# Patient Record
Sex: Female | Born: 1980 | Race: White | Hispanic: No | Marital: Married | Smoking: Former smoker
Health system: Southern US, Community
[De-identification: ages and names within clinical notes are randomized; demographics above are authoritative.]

---

## 2019-12-09 ENCOUNTER — Encounter: Payer: Self-pay | Admitting: Family Medicine

## 2019-12-09 ENCOUNTER — Ambulatory Visit: Payer: Managed Care, Other (non HMO) | Admitting: Family Medicine

## 2019-12-09 ENCOUNTER — Other Ambulatory Visit: Payer: Self-pay

## 2019-12-09 ENCOUNTER — Ambulatory Visit (INDEPENDENT_AMBULATORY_CARE_PROVIDER_SITE_OTHER): Payer: Managed Care, Other (non HMO)

## 2019-12-09 VITALS — BP 110/70 | HR 90 | Wt 197.0 lb

## 2019-12-09 DIAGNOSIS — M545 Low back pain, unspecified: Secondary | ICD-10-CM

## 2019-12-09 DIAGNOSIS — M542 Cervicalgia: Secondary | ICD-10-CM

## 2019-12-09 DIAGNOSIS — M2550 Pain in unspecified joint: Secondary | ICD-10-CM | POA: Diagnosis not present

## 2019-12-09 LAB — COMPREHENSIVE METABOLIC PANEL
ALT: 27 U/L (ref 0–35)
AST: 17 U/L (ref 0–37)
Albumin: 4.3 g/dL (ref 3.5–5.2)
Alkaline Phosphatase: 80 U/L (ref 39–117)
BUN: 14 mg/dL (ref 6–23)
CO2: 24 mEq/L (ref 19–32)
Calcium: 9.2 mg/dL (ref 8.4–10.5)
Chloride: 104 mEq/L (ref 96–112)
Creatinine, Ser: 0.76 mg/dL (ref 0.40–1.20)
GFR: 84.37 mL/min (ref >60.00)
Glucose, Bld: 100 mg/dL — ABNORMAL HIGH (ref 70–99)
Potassium: 3.8 mEq/L (ref 3.5–5.1)
Sodium: 135 mEq/L (ref 135–145)
Total Bilirubin: 0.3 mg/dL (ref 0.2–1.2)
Total Protein: 7.5 g/dL (ref 6.0–8.3)

## 2019-12-09 LAB — CBC WITH DIFFERENTIAL/PLATELET
Basophils Absolute: 0.1 10*3/uL (ref 0.0–0.1)
Basophils Relative: 0.8 % (ref 0.0–3.0)
Eosinophils Absolute: 0.2 10*3/uL (ref 0.0–0.7)
Eosinophils Relative: 2.9 % (ref 0.0–5.0)
HCT: 38.6 % (ref 36.0–46.0)
Hemoglobin: 12.9 g/dL (ref 12.0–15.0)
Lymphocytes Relative: 28.8 % (ref 12.0–46.0)
Lymphs Abs: 2.3 10*3/uL (ref 0.7–4.0)
MCHC: 33.3 g/dL (ref 30.0–36.0)
MCV: 82.2 fl (ref 78.0–100.0)
Monocytes Absolute: 0.5 10*3/uL (ref 0.1–1.0)
Monocytes Relative: 5.8 % (ref 3.0–12.0)
Neutro Abs: 4.9 10*3/uL (ref 1.4–7.7)
Neutrophils Relative %: 61.7 % (ref 43.0–77.0)
Platelets: 284 10*3/uL (ref 150.0–400.0)
RBC: 4.7 Mil/uL (ref 3.87–5.11)
RDW: 13.7 % (ref 11.5–15.5)
WBC: 8 10*3/uL (ref 4.0–10.5)

## 2019-12-09 LAB — URIC ACID: Uric Acid, Serum: 4.8 mg/dL (ref 2.4–7.0)

## 2019-12-09 LAB — C-REACTIVE PROTEIN: CRP: <1 mg/dL (ref 0.5–20.0)

## 2019-12-09 LAB — TSH: TSH: 3.38 u[IU]/mL (ref 0.35–4.50)

## 2019-12-09 LAB — VITAMIN D 25 HYDROXY (VIT D DEFICIENCY, FRACTURES): VITD: 26.64 ng/mL — ABNORMAL LOW (ref 30.00–100.00)

## 2019-12-09 LAB — IBC PANEL
Iron: 52 ug/dL (ref 42–145)
Saturation Ratios: 11.7 % — ABNORMAL LOW (ref 20.0–50.0)
Transferrin: 318 mg/dL (ref 212.0–360.0)

## 2019-12-09 LAB — SEDIMENTATION RATE: Sed Rate: 46 mm/hr — ABNORMAL HIGH (ref 0–20)

## 2019-12-09 LAB — FERRITIN: Ferritin: 16.6 ng/mL (ref 10.0–291.0)

## 2019-12-09 NOTE — Progress Notes (Signed)
[name] [address] [place] [address] Phone: [phone] Subjective:   I Laura Fry am serving as a Neurosurgeon for Dr. Antoine Primas.  This visit occurred during the SARS-CoV-2 public health emergency.  Safety protocols were in place, including screening questions prior to the visit, additional usage of staff PPE, and extensive cleaning of exam room while observing appropriate contact time as indicated for disinfecting solutions.   I'm seeing this patient by the request  of:  Patient, No Pcp Per  CC: Allover pain  [id number]  Laura Fry is a 39 y.o. female coming in with complaint of back pain. Body aches all over her body. Patient states that she also has hearing and mood swings. Has had MRIs before for this pain. Patient states she also has trouble with thinking as well. Accident in 2017 states that since then she has not been the same.   Onset- Chronic  Location - back, bilateral shoulders, anterior neck muscles, sternocleidomastoid  Duration-  Character-stiff  Aggravating factors- sitting for long periods of time, cleaning  Reliving factors-  Therapies tried- chiropractor, PT, acupuncture, myofacial release, massage, lymp draining  Severity-9 out of 10.  Patient states that she cannot tell me exactly what MRI is she has had that she has had in multiple she states.      Social History   Socioeconomic History  . Marital status: Married    Spouse name: Not on file  . Number of children: Not on file  . Years of education: Not on file  . Highest education level: Not on file  Occupational History  . Not on file  Tobacco Use  . Smoking status: Not on file  Substance and Sexual Activity  . Alcohol use: Not on file  . Drug use: Not on file  . Sexual activity: Not on file  Other Topics Concern  . Not on file  Social History Narrative  . Not on file   Social Determinants of Health   Financial Resource Strain:   .  Difficulty of Paying Living Expenses:   Food Insecurity:   . Worried About Programme researcher, broadcasting/film/video in the Last Year:   . Barista in the Last Year:   Transportation Needs:   . Freight forwarder (Medical):   Marland Kitchen Lack of Transportation (Non-Medical):   Physical Activity:   . Days of Exercise per Week:   . Minutes of Exercise per Session:   Stress:   . Feeling of Stress :   Social Connections:   . Frequency of Communication with Friends and Family:   . Frequency of Social Gatherings with Friends and Family:   . Attends Religious Services:   . Active Member of Clubs or Organizations:   . Attends Banker Meetings:   Marland Kitchen Marital Status:    Not on File No family history on file. No current outpatient medications on file.   Reviewed prior external information including notes and imaging from  primary care provider As well as notes that were available from care everywhere and other healthcare systems.  Past medical history, social, surgical and family history all reviewed in electronic medical record.  No pertanent information unless stated regarding to the chief complaint.   Review of Systems:  No , visual changes, nausea, vomiting, diarrhea, constipation, dizziness, abdominal pain, skin rash, fevers, chills, night sweats, weight loss, swollen lymph nodes,  chest pain, shortness of breath, mood changes. POSITIVE muscle aches, body aches, joint  swelling, mood changes, headaches  Objective  Blood pressure 110/70, pulse 90, weight 197 lb (89.4 kg), SpO2 98 %.   General: No apparent distress alert and oriented x3 mood and affect normal, dressed appropriately.  HEENT: Pupils equal, extraocular movements intact  Respiratory: Patient's speak in full sentences and does not appear short of breath  Cardiovascular: No lower extremity edema, non tender, no erythema  Neuro: Cranial nerves II through XII are intact, neurovascularly intact in all extremities with 2+ DTRs and 2+  pulses.  Gait normal with good balance and coordination.  MSK: Patient back exam is tender diffusely.  Patient's pain is out of proportion to the amount of palpation.    Impression and Recommendations:     The above documentation has been reviewed and is accurate and complete Judi Saa, DO       Note: This dictation was prepared with Dragon dictation along with smaller phrase technology. Any transcriptional errors that result from this process are unintentional.

## 2019-12-09 NOTE — Patient Instructions (Addendum)
Good to see you Back and neck xray All labs Good to see you.  Turmeric 500mg  daily  Tart cherry extract 1200mg  at night Vitamin D 2000 IU daily  See me in 6 weeks

## 2019-12-09 NOTE — Assessment & Plan Note (Addendum)
Patient is having polyarthralgia. Recently moved here and has not had any other true medical care at the moment.  I do not have any true work-up of what has been done previously but patient does state MRIs have been noted.  I would like to see if I can get a copy of those MRIs and encourage patient to release some therapy.  In addition to this we will also have patient get laboratory work-up today to see if there is anything else that is contributing with patient stating that she has not had this done with new x-rays ordered today.  Discussed icing regimen and home exercises.  Discussed avoiding certain activities.  Follow-up again in 4 to 8 weeks. Total time with patient today greater than 46 minutes patient did have the smell of marijuana on her today as well.

## 2019-12-11 LAB — RHEUMATOID FACTOR: Rheumatoid fact SerPl-aCnc: <14 IU/mL (ref <14)

## 2019-12-11 LAB — PTH, INTACT AND CALCIUM
Calcium: 9.4 mg/dL (ref 8.6–10.2)
PTH: 29 pg/mL (ref 14–64)

## 2019-12-11 LAB — CYCLIC CITRUL PEPTIDE ANTIBODY, IGG: Cyclic Citrullin Peptide Ab: <16 UNITS

## 2019-12-11 LAB — CALCIUM, IONIZED: Calcium, Ion: 5.13 mg/dL (ref 4.8–5.6)

## 2019-12-11 LAB — ANGIOTENSIN CONVERTING ENZYME: Angiotensin-Converting Enzyme: 27 U/L (ref 9–67)

## 2019-12-11 LAB — ANA: Anti Nuclear Antibody (ANA): NEGATIVE

## 2020-01-20 ENCOUNTER — Other Ambulatory Visit: Payer: Self-pay

## 2020-01-20 ENCOUNTER — Ambulatory Visit: Payer: Managed Care, Other (non HMO) | Admitting: Family Medicine

## 2020-01-20 ENCOUNTER — Encounter: Payer: Self-pay | Admitting: Family Medicine

## 2020-01-20 VITALS — BP 110/80 | HR 73 | Wt 200.0 lb

## 2020-01-20 DIAGNOSIS — M542 Cervicalgia: Secondary | ICD-10-CM

## 2020-01-20 DIAGNOSIS — M2550 Pain in unspecified joint: Secondary | ICD-10-CM | POA: Diagnosis not present

## 2020-01-20 DIAGNOSIS — M545 Low back pain, unspecified: Secondary | ICD-10-CM

## 2020-01-20 DIAGNOSIS — R7989 Other specified abnormal findings of blood chemistry: Secondary | ICD-10-CM

## 2020-01-20 DIAGNOSIS — R700 Elevated erythrocyte sedimentation rate: Secondary | ICD-10-CM | POA: Insufficient documentation

## 2020-01-20 DIAGNOSIS — G8929 Other chronic pain: Secondary | ICD-10-CM

## 2020-01-20 DIAGNOSIS — M549 Dorsalgia, unspecified: Secondary | ICD-10-CM

## 2020-01-20 MED ORDER — VITAMIN D (ERGOCALCIFEROL) 1.25 MG (50000 UNIT) PO CAPS
50000.0000 [IU] | ORAL_CAPSULE | ORAL | 0 refills | Status: AC
Start: 2020-01-20 — End: ?

## 2020-01-20 NOTE — Assessment & Plan Note (Signed)
Continues to have more of a polyarthralgia.  Patient's x-rays do not show any bony abnormality.  Laboratory work-up did show that patient did have low vitamin D as well as some elevated ESR.  At this point we will try some other modalities to see if it will be helpful.  Patient is having more myofascial pain and will be referred to formal physical therapy.  See if they can help with more of the chronic neck in shoulder pain as well as low back pain.  Patient will increase activity slowly.  Follow-up again in 2 months

## 2020-01-20 NOTE — Assessment & Plan Note (Signed)
Start once weekly vitamin D.

## 2020-01-20 NOTE — Assessment & Plan Note (Signed)
We will monitor consider repeating labs in 2 months

## 2020-01-20 NOTE — Patient Instructions (Addendum)
Good to see you Once weekly vitamin D Probiotic at least 10 different stains and 10 billion units [email or web address] PT will call you CoQ10 200 mg daily See me again in 2 months

## 2020-01-20 NOTE — Assessment & Plan Note (Signed)
X-rays normal.  Start physical therapy to help with range of motion follow-up in 2 months

## 2020-01-20 NOTE — Progress Notes (Signed)
[facility] [address] [place] [address] Phone: [phone] Subjective:   I Laura Fry am serving as a Neurosurgeon for Dr. Antoine Primas.  This visit occurred during the SARS-CoV-2 public health emergency.  Safety protocols were in place, including screening questions prior to the visit, additional usage of staff PPE, and extensive cleaning of exam room while observing appropriate contact time as indicated for disinfecting solutions.   I'm seeing this patient by the request  of:  Patient, No Pcp Per  CC: Allover pain follow-up  [id number]   12/09/2019 Patient is having polyarthralgia. Recently moved here and has not had any other true medical care at the moment.  I do not have any true work-up of what has been done previously but patient does state MRIs have been noted.  I would like to see if I can get a copy of those MRIs and encourage patient to release some therapy.  In addition to this we will also have patient get laboratory work-up today to see if there is anything else that is contributing with patient stating that she has not had this done with new x-rays ordered today.  Discussed icing regimen and home exercises.  Discussed avoiding certain activities.  Follow-up again in 4 to 8 weeks. Total time with patient today greater than 46 minutes patient did have the smell of marijuana on her today as well.  12/09/2019 Laura Fry is a 39 y.o. female coming in with complaint of back pain. Patient states she feels about the same.  Patient states that it is more of a dull, throbbing aching sensation still.  Patient states that since she has got the first and her COVID vaccine starting to have increasing discomfort again.   Patient did have x-rays taken December 09, 2019.  These were of the cervical and lumbar spine and independently visualized by me today.  Patient did have very minimal degenerative changes around C4-5, C5-6 and an otherwise unremarkable  of the lumbar spine  Vitamin D is low at 26, mild elevation in sedimentation rate of 46, autoimmune labs unremarkable  No past medical history on file. No past surgical history on file. Social History   Socioeconomic History  . Marital status: Married    Spouse name: Not on file  . Number of children: Not on file  . Years of education: Not on file  . Highest education level: Not on file  Occupational History  . Not on file  Tobacco Use  . Smoking status: Not on file  Substance and Sexual Activity  . Alcohol use: Not on file  . Drug use: Not on file  . Sexual activity: Not on file  Other Topics Concern  . Not on file  Social History Narrative  . Not on file   Social Determinants of Health   Financial Resource Strain:   . Difficulty of Paying Living Expenses:   Food Insecurity:   . Worried About Programme researcher, broadcasting/film/video in the Last Year:   . Barista in the Last Year:   Transportation Needs:   . Freight forwarder (Medical):   Marland Kitchen Lack of Transportation (Non-Medical):   Physical Activity:   . Days of Exercise per Week:   . Minutes of Exercise per Session:   Stress:   . Feeling of Stress :   Social Connections:   . Frequency of Communication with Friends and Family:   . Frequency of Social Gatherings with Friends and Family:   .  Attends Religious Services:   . Active Member of Clubs or Organizations:   . Attends Banker Meetings:   Marland Kitchen Marital Status:    Not on File No family history on file.       Current Outpatient Medications (Other):  Marland Kitchen  Vitamin D, Ergocalciferol, (DRISDOL) 1.25 MG (50000 UNIT) CAPS capsule, Take 1 capsule (50,000 Units total) by mouth every 7 (seven) days.   Reviewed prior external information including notes and imaging from  primary care provider As well as notes that were available from care everywhere and other healthcare systems.  Past medical history, social, surgical and family history all reviewed in  electronic medical record.  No pertanent information unless stated regarding to the chief complaint.   Review of Systems:  No headache, visual changes, nausea, vomiting, diarrhea, constipation, dizziness, abdominal pain, skin rash, fevers, chills, night sweats, weight loss, swollen lymph nodes, , , chest pain, shortness of breath, mood changes. POSITIVE muscle aches, body aches, joint swelling  Objective  Blood pressure 110/80, pulse 73, weight 200 lb (90.7 kg), SpO2 93 %.   General: No apparent distress alert and oriented x3 mood and affect normal, dressed appropriately.  HEENT: Pupils equal, extraocular movements intact patient does have bloodshot eyes Respiratory: Patient's speak in full sentences and does not appear short of breath  Cardiovascular: No lower extremity edema, non tender, no erythema  Neuro: Cranial nerves II through XII are intact, neurovascularly intact in all extremities with 2+ DTRs and 2+ pulses.  Gait normal with good balance and coordination.  MSK: Diffuse tender with full range of motion and good stability and symmetric strength and tone of shoulders, elbows, wrist, hip, knee and ankles bilaterally.  Patient does sit a little more comfortable than last visit    Impression and Recommendations:     The above documentation has been reviewed and is accurate and complete Judi Saa, DO       Note: This dictation was prepared with Dragon dictation along with smaller phrase technology. Any transcriptional errors that result from this process are unintentional.

## 2020-03-23 ENCOUNTER — Ambulatory Visit (INDEPENDENT_AMBULATORY_CARE_PROVIDER_SITE_OTHER): Payer: Managed Care, Other (non HMO) | Admitting: Family Medicine

## 2020-03-23 DIAGNOSIS — Z5329 Procedure and treatment not carried out because of patient's decision for other reasons: Secondary | ICD-10-CM

## 2020-03-23 NOTE — Progress Notes (Signed)
No show

## 2021-04-06 ENCOUNTER — Emergency Department
Admission: EM | Admit: 2021-04-06 | Discharge: 2021-04-06 | Disposition: A | Discharge status: Home / Self Care | Payer: Self-pay | Attending: Emergency Medicine | Admitting: Emergency Medicine

## 2021-04-06 ENCOUNTER — Other Ambulatory Visit: Payer: Self-pay

## 2021-04-06 ENCOUNTER — Encounter: Payer: Self-pay | Admitting: Emergency Medicine

## 2021-04-06 ENCOUNTER — Emergency Department: Payer: Self-pay

## 2021-04-06 DIAGNOSIS — Z5321 Procedure and treatment not carried out due to patient leaving prior to being seen by health care provider: Secondary | ICD-10-CM | POA: Insufficient documentation

## 2021-04-06 DIAGNOSIS — R059 Cough, unspecified: Secondary | ICD-10-CM | POA: Insufficient documentation

## 2021-04-06 DIAGNOSIS — R0981 Nasal congestion: Secondary | ICD-10-CM | POA: Insufficient documentation

## 2021-04-06 NOTE — ED Triage Notes (Signed)
Pt to ED from home c/o cough and congestion for last week.  States was around people at conference with COVID.  States thick mucous and hard to cough up.  Pt A&Ox4, chest rise even and unlabored, speaking in full and complete sentences and in NAD at this time.  Has been away from COVID exposure for 2 weeks.

## 2021-04-08 ENCOUNTER — Ambulatory Visit: Payer: Self-pay | Admitting: *Deleted

## 2021-04-08 NOTE — Telephone Encounter (Signed)
Pt called saying she went to [facility] Wednesday but she left after waiting 4 hours.  She is having coughing.   She does not have a primary care doctor but wants to be seen somewhere asap.  There is nothing for a new patient right away.   CB  [phone]   Called patient to review symptoms. No answer, left message on voicemail to  call back at #[phone].

## 2021-04-08 NOTE — Telephone Encounter (Signed)
Patient called, no answer, unable to leave VM due to mailbox is full.  Message from Otis Brace sent at 04/08/2021 12:00 PM EDT  Pt called saying she went to [facility] Wednesday but she left after waiting 4 hours.  She is having coughing.   She does not have a primary care doctor but wants to be seen somewhere asap.  There is nothing for a new patient right away.   CB  [phone]

## 2021-04-08 NOTE — Telephone Encounter (Signed)
Attempted to call patient- no answer and mailbox is full.

## 2021-10-13 ENCOUNTER — Emergency Department: Payer: 59

## 2021-10-13 ENCOUNTER — Emergency Department
Admission: EM | Admit: 2021-10-13 | Discharge: 2021-10-14 | Disposition: A | Discharge status: Home / Self Care | Payer: 59 | Attending: Emergency Medicine | Admitting: Emergency Medicine

## 2021-10-13 DIAGNOSIS — R350 Frequency of micturition: Secondary | ICD-10-CM | POA: Diagnosis not present

## 2021-10-13 DIAGNOSIS — M549 Dorsalgia, unspecified: Secondary | ICD-10-CM | POA: Diagnosis present

## 2021-10-13 DIAGNOSIS — R109 Unspecified abdominal pain: Secondary | ICD-10-CM | POA: Insufficient documentation

## 2021-10-13 DIAGNOSIS — G8929 Other chronic pain: Secondary | ICD-10-CM | POA: Diagnosis not present

## 2021-10-13 DIAGNOSIS — M546 Pain in thoracic spine: Secondary | ICD-10-CM | POA: Insufficient documentation

## 2021-10-13 DIAGNOSIS — Z8616 Personal history of COVID-19: Secondary | ICD-10-CM | POA: Insufficient documentation

## 2021-10-13 LAB — URINALYSIS, ROUTINE W REFLEX MICROSCOPIC
Bilirubin Urine: NEGATIVE
Glucose, UA: NEGATIVE mg/dL
Ketones, ur: NEGATIVE mg/dL
Nitrite: NEGATIVE
Protein, ur: NEGATIVE mg/dL
Specific Gravity, Urine: 1.029 (ref 1.005–1.030)
pH: 5 (ref 5.0–8.0)

## 2021-10-13 LAB — CBC WITH DIFFERENTIAL/PLATELET
Abs Immature Granulocytes: 0.02 10*3/uL (ref 0.00–0.07)
Basophils Absolute: 0.1 10*3/uL (ref 0.0–0.1)
Basophils Relative: 1 %
Eosinophils Absolute: 0.5 10*3/uL (ref 0.0–0.5)
Eosinophils Relative: 7 %
HCT: 39.6 % (ref 36.0–46.0)
Hemoglobin: 12.7 g/dL (ref 12.0–15.0)
Immature Granulocytes: 0 %
Lymphocytes Relative: 32 %
Lymphs Abs: 2.2 10*3/uL (ref 0.7–4.0)
MCH: 26.4 pg (ref 26.0–34.0)
MCHC: 32.1 g/dL (ref 30.0–36.0)
MCV: 82.3 fL (ref 80.0–100.0)
Monocytes Absolute: 0.4 10*3/uL (ref 0.1–1.0)
Monocytes Relative: 6 %
Neutro Abs: 3.7 10*3/uL (ref 1.7–7.7)
Neutrophils Relative %: 54 %
Platelets: 314 10*3/uL (ref 150–400)
RBC: 4.81 MIL/uL (ref 3.87–5.11)
RDW: 13.2 % (ref 11.5–15.5)
WBC: 6.9 10*3/uL (ref 4.0–10.5)
nRBC: 0 % (ref 0.0–0.2)

## 2021-10-13 LAB — COMPREHENSIVE METABOLIC PANEL
ALT: 24 U/L (ref 0–44)
AST: 22 U/L (ref 15–41)
Albumin: 4.1 g/dL (ref 3.5–5.0)
Alkaline Phosphatase: 81 U/L (ref 38–126)
Anion gap: 9 (ref 5–15)
BUN: 18 mg/dL (ref 6–20)
CO2: 25 mmol/L (ref 22–32)
Calcium: 9.5 mg/dL (ref 8.9–10.3)
Chloride: 104 mmol/L (ref 98–111)
Creatinine, Ser: 0.83 mg/dL (ref 0.44–1.00)
GFR, Estimated: >60 mL/min (ref >60)
Glucose, Bld: 82 mg/dL (ref 70–99)
Potassium: 3.8 mmol/L (ref 3.5–5.1)
Sodium: 138 mmol/L (ref 135–145)
Total Bilirubin: 0.5 mg/dL (ref 0.3–1.2)
Total Protein: 7.7 g/dL (ref 6.5–8.1)

## 2021-10-13 LAB — TSH: TSH: 1.88 u[IU]/mL (ref 0.350–4.500)

## 2021-10-13 LAB — POC URINE PREG, ED: Preg Test, Ur: NEGATIVE

## 2021-10-13 LAB — PREGNANCY, URINE: Preg Test, Ur: NEGATIVE

## 2021-10-13 NOTE — ED Provider Notes (Signed)
? ?[facility] ?Provider Note ? ? ? Event Date/Time  ? First MD Initiated Contact with Patient 10/13/21 2349   ?  (approximate) ? ? ?History  ? ?Back Pain ? ? ?HPI ? ?Laura Fry is a 41 y.o. female with past medical history of vitamin D deficiency, chronic neck and back pain who presents with back pain.  Patient tells me that she has had issues with neck and back pain in the past.  Seems to have worsened after she had COVID in October 2022 and then again when she had COVID in January.  She received Paxlovid for COVID in January and this seemed to make her back pain worse.  Pain is in the bilateral flank region and radiates around to the midline.  It is constant and daily.  She denies focal numbness tingling weakness in arms or legs now.  She does have some urinary frequency denies urgency or dysuria.  No fevers chills nausea vomiting or abdominal pain.  Since having COVID patient has also had wheezing and more dyspnea.  She had been seen at urgent care initially when she was diagnosed with UTI and was told that ?She should just come to the emergency department.  There was no acute change that made her come tonight she just felt that her quality of life is been going downhill since have been COVID and she needed to do something about it.  She does not have a primary care physician. ?  ? ?History reviewed. No pertinent past medical history. ? ?Patient Active Problem List  ? Diagnosis Date Noted  ? Low vitamin D level 01/20/2020  ? Chronic neck and back pain 01/20/2020  ? Elevated sed rate 01/20/2020  ? Polyarthralgia 12/09/2019  ? ? ? ?Physical Exam  ?Triage Vital Signs: ?ED Triage Vitals [10/13/21 1653]  ?Enc Vitals Group  ?   BP (!) 143/88  ?   Pulse Rate 86  ?   Resp 18  ?   Temp   ?   Temp src   ?   SpO2 96 %  ?   Weight   ?   Height 5\' 2"  (1.575 m)  ?   Head Circumference   ?   Peak Flow   ?   Pain Score 7  ?   Pain Loc   ?   Pain Edu?   ?   Excl. in GC?   ? ? ?Most recent vital  signs: ?Vitals:  ? 10/13/21 2019 10/14/21 0024  ?BP: 105/64 119/90  ?Pulse: 68 95  ?Resp: 16 17  ?Temp:  97.8 ?F (36.6 ?C)  ?SpO2: 97% 99%  ? ? ? ?General: Awake, no distress.  ?CV:  Good peripheral perfusion.  ?Resp:  Normal effort.  Mild expiratory wheezing but good air movement ?Abd:  No distention.  ?Neuro:             Awake, Alert, Oriented x 3  ?Other:  No focal CVA tenderness, no midline T or L-spine tenderness ?5 out of 5 strength with elbow flexion extension and grip ? ? ?ED Results / Procedures / Treatments  ?Labs ?(all labs ordered are listed, but only abnormal results are displayed) ?Labs Reviewed  ?URINALYSIS, ROUTINE W REFLEX MICROSCOPIC - Abnormal; Notable for the following components:  ?    Result Value  ? Color, Urine YELLOW (*)   ? APPearance HAZY (*)   ? Hgb urine dipstick MODERATE (*)   ? Leukocytes,Ua TRACE (*)   ? Bacteria,  UA RARE (*)   ? All other components within normal limits  ?CBC WITH DIFFERENTIAL/PLATELET  ?COMPREHENSIVE METABOLIC PANEL  ?PREGNANCY, URINE  ?TSH  ?POC URINE PREG, ED  ? ? ? ?EKG ? ? ? ?RADIOLOGY ?I reviewed the CXR which does not show any acute cardiopulmonary process; agree with radiology report  ? ? ? ?PROCEDURES: ? ?Critical Care performed: No ? ?Procedures ? ? ? ?MEDICATIONS ORDERED IN ED: ?Medications  ?acetaminophen (TYLENOL) tablet 1,000 mg (1,000 mg Oral Given 10/14/21 0023)  ?ibuprofen (ADVIL) tablet 400 mg (400 mg Oral Given 10/14/21 0024)  ? ? ? ?IMPRESSION / MDM / ASSESSMENT AND PLAN / ED COURSE  ?I reviewed the triage vital signs and the nursing notes. ?             ?               ? ?Differential diagnosis includes, but is not limited to, musculoskeletal pain, lumbar strain/thoracic strain, long covid, pyelonephritis, UTI ? ?Patient is a 41 year old female who has history of chronic neck and back pain presents with ongoing back pain.  It is in the bilateral flanks and thoracic region.  Had had issues with this prior to having COVID in October but seems to have  worsened since having COVID in October and then again in January.  She has daily pain which has minimal improvement with ibuprofen.  Denies fevers or acute neurologic symptoms.  Has urinary frequency but no urgency or dysuria.  There is no acute change tonight she is just had it and felt that she needed to do something about it.  Patient's vital signs are within normal limits.  Exam she is well-appearing she has no tenderness pain is rather diffuse and not in the focal CVA region either.  She has good strength in her upper extremities.  Labs are overall reassuring normal CBC CMP and UA is not consistent with UTI.  Chest x-ray obtained which is negative for acute process.  My suspicion for serious underlying pathology given the timeframe of her symptoms her physical exam and labs is overall low.  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  Recommended naproxen and Lidoderm patch I also recommended following up with a primary care provider which she is not yet established with.  She may need to see a pulmonologist given her respiratory symptoms after COVID as well.  I also discussed other therapeutic options like acupuncture, seeing a chiropractor or physical therapist.  Patient says she has tried this for her other back pain and was not very interested in these options   ? ?  ? ? ?FINAL CLINICAL IMPRESSION(S) / ED DIAGNOSES  ? ?Final diagnoses:  ?Chronic bilateral thoracic back pain  ? ? ? ?Rx / DC Orders  ? ?ED Discharge Orders   ? ?      Ordered  ?  albuterol (VENTOLIN HFA) 108 (90 Base) MCG/ACT inhaler  Every 6 hours PRN       ? 10/14/21 0025  ?  naproxen (NAPROSYN) 250 MG tablet  2 times daily with meals       ? 10/14/21 0025  ?  lidocaine (LIDODERM) 5 %  Every 24 hours       ? 10/14/21 0025  ? ?  ?  ? ?  ? ? ? ?Note:  This document was prepared using Dragon voice recognition software and may include unintentional dictation errors. ?  ?Georga Hacking,  MD ?10/14/21 0026 ? ?

## 2021-10-13 NOTE — ED Provider Triage Note (Signed)
?  Emergency Medicine Provider Triage Evaluation Note ? ?Laura Fry , a 41 y.o.female,  was evaluated in triage.  Pt complains of several symptoms, including body aches, flank pain, low-grade fevers, weakness, urinary frequency, fatigue, and shortness of breath that has been going on for the past 4 months.  No acute episodes recently, just unsure what his going wrong. ? ? ?Review of Systems  ?Positive: Urinary frequency, myalgias, fevers, body aches, weakness ?Negative: Denies abdominal pain, chest pain, vomiting ? ?Physical Exam  ? ?Vitals:  ? 10/13/21 1653  ?BP: (!) 143/88  ?Pulse: 86  ?Resp: 18  ?SpO2: 96%  ? ?Gen:   Awake, no distress   ?Resp:  Normal effort  ?MSK:   Moves extremities without difficulty  ?Other:   ? ?Medical Decision Making  ?Given the patient's initial medical screening exam, the following diagnostic evaluation has been ordered. The patient will be placed in the appropriate treatment space, once one is available, to complete the evaluation and treatment. I have discussed the plan of care with the patient and I have advised the patient that an ED physician or mid-level practitioner will reevaluate their condition after the test results have been received, as the results may give them additional insight into the type of treatment they may need.  ? ? ?Diagnostics: Labs, UA, CXR ? ?Treatments: none immediately ?  ?Teodoro Spray, PA ?10/13/21 1700 ? ?

## 2021-10-13 NOTE — ED Triage Notes (Signed)
Patient to ER via POV with complaints of bilateral flank pain. States that her shoulders also hurt, describes it as feeling like she is carrying a sack of rocks. Reports that she has been checking her temperature at home and it has been low. Reports fatigue and generalized weakness. Reports that her health has been declining since October.  ? ?Denies dysuria but states that she has had some urinary frequency.  ?

## 2021-10-14 MED ORDER — IBUPROFEN 400 MG PO TABS
400.0000 mg | ORAL_TABLET | Freq: Once | ORAL | Status: AC
Start: 2021-10-14 — End: 2021-10-14
  Administered 2021-10-14: 400 mg via ORAL
  Filled 2021-10-14: qty 1

## 2021-10-14 MED ORDER — LIDOCAINE 5 % EX PTCH: 1.0000 | MEDICATED_PATCH | CUTANEOUS | 0 refills | Status: AC

## 2021-10-14 MED ORDER — ACETAMINOPHEN 500 MG PO TABS
1000.0000 mg | ORAL_TABLET | Freq: Once | ORAL | Status: AC
  Administered 2021-10-14: 1000 mg via ORAL
  Filled 2021-10-14: qty 2

## 2021-10-14 MED ORDER — ALBUTEROL SULFATE HFA 108 (90 BASE) MCG/ACT IN AERS: 2.0000 | INHALATION_SPRAY | Freq: Four times a day (QID) | RESPIRATORY_TRACT | 2 refills | Status: DC | PRN

## 2021-10-14 MED ORDER — NAPROXEN 250 MG PO TABS
250.0000 mg | ORAL_TABLET | Freq: Two times a day (BID) | ORAL | 0 refills | Status: AC
Start: 2021-10-14 — End: 2021-11-13

## 2021-10-14 NOTE — Discharge Instructions (Signed)
Your lab work, chest x-ray and urine sample were all reassuring today.  Your pain may be related to long COVID.  I recommend that you follow-up with your primary care provider.  I have prescribed naproxen and a Lidoderm patch and an albuterol inhaler. ?

## 2022-05-10 ENCOUNTER — Ambulatory Visit (INDEPENDENT_AMBULATORY_CARE_PROVIDER_SITE_OTHER): Payer: BC Managed Care – PPO | Admitting: Pulmonary Disease

## 2022-05-10 ENCOUNTER — Other Ambulatory Visit
Admission: RE | Admit: 2022-05-10 | Discharge: 2022-05-10 | Disposition: A | Discharge status: Home / Self Care | Payer: BC Managed Care – PPO | Attending: Pulmonary Disease | Admitting: Pulmonary Disease

## 2022-05-10 ENCOUNTER — Encounter: Payer: Self-pay | Admitting: Pulmonary Disease

## 2022-05-10 VITALS — BP 128/82 | HR 90 | Temp 98.3°F | Ht 62.0 in | Wt 213.4 lb

## 2022-05-10 DIAGNOSIS — J45998 Other asthma: Secondary | ICD-10-CM | POA: Insufficient documentation

## 2022-05-10 DIAGNOSIS — Z7689 Persons encountering health services in other specified circumstances: Secondary | ICD-10-CM

## 2022-05-10 DIAGNOSIS — R053 Chronic cough: Secondary | ICD-10-CM

## 2022-05-10 LAB — CBC WITH DIFFERENTIAL/PLATELET
Abs Immature Granulocytes: 0.02 10*3/uL (ref 0.00–0.07)
Basophils Absolute: 0.1 10*3/uL (ref 0.0–0.1)
Basophils Relative: 1 %
Eosinophils Absolute: 0.5 10*3/uL (ref 0.0–0.5)
Eosinophils Relative: 7 %
HCT: 41.6 % (ref 36.0–46.0)
Hemoglobin: 13.3 g/dL (ref 12.0–15.0)
Immature Granulocytes: 0 %
Lymphocytes Relative: 22 %
Lymphs Abs: 1.5 10*3/uL (ref 0.7–4.0)
MCH: 26.3 pg (ref 26.0–34.0)
MCHC: 32 g/dL (ref 30.0–36.0)
MCV: 82.4 fL (ref 80.0–100.0)
Monocytes Absolute: 0.4 10*3/uL (ref 0.1–1.0)
Monocytes Relative: 6 %
Neutro Abs: 4.3 10*3/uL (ref 1.7–7.7)
Neutrophils Relative %: 64 %
Platelets: 280 10*3/uL (ref 150–400)
RBC: 5.05 MIL/uL (ref 3.87–5.11)
RDW: 14.3 % (ref 11.5–15.5)
WBC: 6.7 10*3/uL (ref 4.0–10.5)
nRBC: 0 % (ref 0.0–0.2)

## 2022-05-10 LAB — NITRIC OXIDE: Nitric Oxide: 63

## 2022-05-10 MED ORDER — TRELEGY ELLIPTA 200-62.5-25 MCG/ACT IN AEPB: 1.0000 | INHALATION_SPRAY | Freq: Every day | RESPIRATORY_TRACT | 11 refills | Status: DC

## 2022-05-10 MED ORDER — TRELEGY ELLIPTA 200-62.5-25 MCG/ACT IN AEPB: 1.0000 | INHALATION_SPRAY | Freq: Every day | RESPIRATORY_TRACT | 0 refills | Status: DC

## 2022-05-10 MED ORDER — ALBUTEROL SULFATE HFA 108 (90 BASE) MCG/ACT IN AERS: 2.0000 | INHALATION_SPRAY | Freq: Four times a day (QID) | RESPIRATORY_TRACT | 2 refills | Status: AC | PRN

## 2022-05-10 NOTE — Patient Instructions (Addendum)
You have asthma.  The level of inflammation in your airway today was 63.  This is high.  The mucus you feel is because your airways have inflammation and a lot of mucus is produced with this.  Medication to reduce inflammation is needed for this.  You need a maintenance inhaler to keep the inflammation down.  We are giving you a trial of an inhaler called Trelegy Ellipta that should help with reducing inflammation.  This is 1 puff daily make sure you rinse your mouth well after you use it.   You may continue using your albuterol rescue inhaler as needed.  Fully with the maintenance inhaler you should not need to use this much at all.  We are checking blood work to determine if you have specific allergies that may be triggers.  We are also checking breathing tests that will tell us about the status of your lungs.  We are making a referral to the primary care group at [address] [facility]) this is a very good group of physicians.  We will see him in follow-up and 6 to 8 weeks time call sooner should any new problems arise.  Let us know if you have any difficulties with the medications prescribed.

## 2022-05-10 NOTE — Progress Notes (Signed)
Subjective:    Patient ID: Laura Fry, female    DOB: 02-26-1981, 41 y.o.   MRN: [id number] Patient Care Team: Pcp, No as PCP - General  Chief Complaint  Patient presents with   Consult    Had Covid 2 times a year ago. Now she is having flare ups of dry cough and wheezing.   HPI Laura Fry is a 41 year old former smoker with a 15-pack-year history of smoking, who presents for evaluation of cough and wheezing.  She is self-referred.  She has no primary care provider.  The patient states that in October of last year while she was traveling to [facility] she developed COVID and since then has noted that she has had a lingering cough occasional wheezing and increased mucus production.  She cannot tell if anything makes it worse.  She does not feel anything makes it any better.  She was given albuterol prescription that she uses rarely and does not know if it works or not.  She has only been seen urgent care and emergency room without consistent follow-up.  She notes that she developed "allergies" after moving to [place] in 2019.  Previously she resided in [place] and in [place].  Has noted significant weight gain over the last year for total of 43 pounds.  He has tried Zyrtec for her allergies but this dried her out too much and actually made her cough worse.  She does not recall having asthma as a child but states that she has always had a cough.  She has not had any fevers, chills or sweats no chest pain.  No tachypalpitations.  No lower extremity edema or calf tenderness.  No gastroesophageal reflux symptoms.  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  As noted she smoke 1 pack of cigarettes per day for 15 years.  She quit 13 years ago.  She occasionally uses marijuana but has not use this lately.  She has a cat as a Administrator, arts.  No unusual hobbies.  No hot tubs in the home.   Review of Systems A 10 point review of systems was performed and it is as noted above otherwise  negative.  History reviewed. No pertinent past medical history.  History reviewed. No pertinent surgical history.  Family History  Problem Relation Age of Onset   Diabetes Mother    Hypertension Father    Social History   Tobacco Use   Smoking status: Former    Packs/day: 1.00    Years: 15.00    Total pack years: 15.00    Types: Cigarettes   Smokeless tobacco: Never  Substance Use Topics   Alcohol use: Yes    Comment: occ.   No Known Allergies  Current Meds  Medication Sig   albuterol (PROVENTIL) (2.5 MG/3ML) 0.083% nebulizer solution Take 2.5 mg by nebulization every 6 (six) hours as needed for wheezing or shortness of breath.   albuterol (VENTOLIN HFA) 108 (90 Base) MCG/ACT inhaler Inhale 2 puffs into the lungs every 6 (six) hours as needed for wheezing or shortness of breath.   diphenhydrAMINE (BENADRYL) 25 mg capsule Take 25 mg by mouth every 6 (six) hours as needed.    There is no immunization history on file for this patient.     Objective:   Physical Exam BP 128/82 (BP Location: Left Arm, Cuff Size: Normal)   Pulse 90   Temp 98.3 F (36.8 C)   Ht 5\' 2"  (1.575 m)   Wt 213  lb 6.4 oz (96.8 kg)   SpO2 97%   BMI 39.03 kg/m  GENERAL: Well-developed obese woman, no acute distress, HEAD: Normocephalic, atraumatic.  EYES: Pupils equal, round, reactive to light.  No scleral icterus.  MOUTH: Nose/mouth/throat not examined due to masking requirements for COVID 19. NECK: Supple. No thyromegaly. Trachea midline. No JVD.  No adenopathy. PULMONARY: Good air entry bilaterally.  Scattered rhonchi and wheezes throughout. CARDIOVASCULAR: S1 and S2. Regular rate and rhythm.  No rubs, murmurs or gallops heard. ABDOMEN: Obese, otherwise benign. MUSCULOSKELETAL: No joint deformity, no clubbing, no edema.  NEUROLOGIC: No overt focal deficit, no gait disturbance, speech is fluent. SKIN: Intact,warm,dry. PSYCH: Speech pressured at times, appears somewhat anxious.  Chest x-ray  from 13 Oct 2021 showing no active disease:   Lab Results  Component Value Date   NITRICOXIDE 63 05/10/2022  Consistent with type II inflammation in the airway     Assessment & Plan:     ICD-10-CM   1. Chronic cough  R05.3 Nitric oxide   Suspect due to poorly controlled asthma Evidence of type II inflammation in the airway    2. Persistent asthma with undetermined severity  J45.998 Pulmonary Function Test [facility] Only    Allergen Panel (27) + IGE    CBC with Differential/Platelet    CANCELED: CBC with Differential/Platelet    CANCELED: Allergen Panel (27) + IGE   Will obtain PFTs Trial of Trelegy Ellipta 100, 1 puff daily Albuterol as needed    3. Encounter to establish care with new doctor  Z76.89 Ambulatory Referral to Primary Care   Patient needs primary care physician I have referred to [facility]     Orders Placed This Encounter  Procedures   Allergen Panel (27) + IGE    Standing Status:   Future    Number of Occurrences:   1    Standing Expiration Date:   05/11/2023   CBC with Differential/Platelet    Standing Status:   Future    Number of Occurrences:   1    Standing Expiration Date:   05/11/2023   Ambulatory Referral to Primary Care    Referral Priority:   Routine    Referral Type:   Consultation    Referral Reason:   Specialty Services Required    Number of Visits Requested:   1   Nitric oxide   Pulmonary Function Test [facility] Only    Standing Status:   Future    Standing Expiration Date:   05/11/2023    Order Specific Question:   Full PFT: includes the following: basic spirometry, spirometry pre & post bronchodilator, diffusion capacity (DLCO), lung volumes    Answer:   Full PFT    Order Specific Question:   This test can only be performed at    Answer:   [facility]   Meds ordered this encounter  Medications   Fluticasone-Umeclidin-Vilant (TRELEGY ELLIPTA) 200-62.5-25 MCG/ACT AEPB    Sig: Inhale 1 puff into the lungs daily.    Dispense:  14  each    Refill:  0    Order Specific Question:   Lot Number?    Answer:   [id number]    Order Specific Question:   Expiration Date?    Answer:   11/11/2023    Order Specific Question:   Quantity    Answer:   1   Fluticasone-Umeclidin-Vilant (TRELEGY ELLIPTA) 200-62.5-25 MCG/ACT AEPB    Sig: Inhale 1 puff into the lungs daily.    Dispense:  28 each    Refill:  11   albuterol (VENTOLIN HFA) 108 (90 Base) MCG/ACT inhaler    Sig: Inhale 2 puffs into the lungs every 6 (six) hours as needed for wheezing or shortness of breath.    Dispense:  8 g    Refill:  2   Patient has evidence of type II inflammation in the airway which is classic for asthma.  Suspect her office related to poorly controlled asthma.  Have initiated management as above.  Will obtain PFTs to further clarify.  In addition we will obtain CBC with differential to evaluate for eosinophilia.  Allergen panel as well.  Patient was given educational materials with regards to asthma.  Will see the patient in follow-up in 8 to 8 weeks time she is to call sooner should any new difficulties arise.  Gailen Shelter, MD Advanced Bronchoscopy PCCM [facility]    *This note was dictated using voice recognition software/Dragon.  Despite best efforts to proofread, errors can occur which can change the meaning. Any transcriptional errors that result from this process are unintentional and may not be fully corrected at the time of dictation.

## 2022-05-10 NOTE — Progress Notes (Signed)
I demonstrated to the patient how to use the Trelegy inhaler. She is aware and did not have any questions.

## 2022-05-12 LAB — ALLERGEN PANEL (27) + IGE
Alternaria Alternata IgE: <0.1 kU/L
Aspergillus Fumigatus IgE: <0.1 kU/L
Bahia Grass IgE: <0.1 kU/L
Bermuda Grass IgE: <0.1 kU/L
Cat Dander IgE: 35.8 kU/L — AB
Cedar, Mountain IgE: <0.1 kU/L
Cladosporium Herbarum IgE: <0.1 kU/L
Cocklebur IgE: <0.1 kU/L
Cockroach, American IgE: <0.1 kU/L
Common Silver Birch IgE: <0.1 kU/L
D Farinae IgE: 0.65 kU/L — AB
D Pteronyssinus IgE: 0.36 kU/L — AB
Dog Dander IgE: 0.89 kU/L — AB
Elm, American IgE: <0.1 kU/L
Hickory, White IgE: <0.1 kU/L
IgE (Immunoglobulin E), Serum: 340 IU/mL (ref 6–495)
Johnson Grass IgE: <0.1 kU/L
Kentucky Bluegrass IgE: <0.1 kU/L
Maple/Box Elder IgE: <0.1 kU/L
Mucor Racemosus IgE: <0.1 kU/L
Oak, White IgE: <0.1 kU/L
Penicillium Chrysogen IgE: <0.1 kU/L
Pigweed, Rough IgE: <0.1 kU/L
Plantain, English IgE: <0.1 kU/L
Ragweed, Short IgE: <0.1 kU/L
Setomelanomma Rostrat: <0.1 kU/L
Timothy Grass IgE: <0.1 kU/L
White Mulberry IgE: <0.1 kU/L

## 2022-05-30 ENCOUNTER — Ambulatory Visit: Payer: 59 | Attending: Pulmonary Disease

## 2022-06-06 ENCOUNTER — Telehealth: Payer: Self-pay | Admitting: Pulmonary Disease

## 2022-06-06 NOTE — Telephone Encounter (Signed)
I have spoke with Laura Fry and her PFT has been rescheduled on 06/08/22 @4 :00pm

## 2022-06-08 ENCOUNTER — Ambulatory Visit: Payer: BC Managed Care – PPO | Attending: Pulmonary Disease

## 2022-06-08 DIAGNOSIS — J45998 Other asthma: Secondary | ICD-10-CM | POA: Diagnosis present

## 2022-06-08 LAB — PULMONARY FUNCTION TEST ARMC ONLY
DL/VA % pred: 102 %
DL/VA: 4.57 ml/min/mmHg/L
DLCO unc % pred: 102 %
DLCO unc: 20.71 ml/min/mmHg
FEF 25-75 Post: 1.95 L/sec
FEF 25-75 Pre: 4.12 L/sec
FEF2575-%Change-Post: -52 %
FEF2575-%Pred-Post: 66 %
FEF2575-%Pred-Pre: 139 %
FEV1-%Change-Post: -17 %
FEV1-%Pred-Post: 88 %
FEV1-%Pred-Pre: 107 %
FEV1-Post: 2.48 L
FEV1-Pre: 3 L
FEV1FVC-%Change-Post: -16 %
FEV1FVC-%Pred-Pre: 107 %
FEV6-%Change-Post: 0 %
FEV6-%Pred-Post: 99 %
FEV6-%Pred-Pre: 99 %
FEV6-Post: 3.36 L
FEV6-Pre: 3.34 L
FEV6FVC-%Pred-Post: 101 %
FEV6FVC-%Pred-Pre: 101 %
FVC-%Change-Post: -1 %
FVC-%Pred-Post: 98 %
FVC-%Pred-Pre: 99 %
FVC-Post: 3.36 L
FVC-Pre: 3.41 L
Post FEV1/FVC ratio: 74 %
Post FEV6/FVC ratio: 100 %
Pre FEV1/FVC ratio: 88 %
Pre FEV6/FVC Ratio: 100 %
RV % pred: 3 %
RV: 0.05 L
TLC % pred: 81 %
TLC: 3.86 L

## 2022-06-08 MED ORDER — ALBUTEROL SULFATE (2.5 MG/3ML) 0.083% IN NEBU
2.5000 mg | INHALATION_SOLUTION | Freq: Once | RESPIRATORY_TRACT | Status: AC
Start: 2022-06-08 — End: 2022-06-08
  Administered 2022-06-08: 2.5 mg via RESPIRATORY_TRACT

## 2022-06-21 ENCOUNTER — Ambulatory Visit: Payer: 59 | Admitting: Pulmonary Disease

## 2022-06-21 ENCOUNTER — Encounter: Payer: Self-pay | Admitting: Primary Care

## 2022-06-21 ENCOUNTER — Ambulatory Visit: Payer: BC Managed Care – PPO | Admitting: Primary Care

## 2022-06-21 VITALS — BP 118/80 | HR 81 | Temp 97.1°F | Ht 62.0 in | Wt 216.0 lb

## 2022-06-21 DIAGNOSIS — J45998 Other asthma: Secondary | ICD-10-CM

## 2022-06-21 DIAGNOSIS — J4530 Mild persistent asthma, uncomplicated: Secondary | ICD-10-CM

## 2022-06-21 LAB — NITRIC OXIDE: Nitric Oxide: 10

## 2022-06-21 MED ORDER — TRELEGY ELLIPTA 200-62.5-25 MCG/ACT IN AEPB: 1.0000 | INHALATION_SPRAY | Freq: Every day | RESPIRATORY_TRACT | 0 refills | Status: DC

## 2022-06-21 NOTE — Progress Notes (Signed)
@Patient  ID: Laura Fry, female    DOB: [birth year], 42 y.o.   MRN: [id number]  Chief Complaint  Patient presents with   Follow-up    Breathing is good. No SOB, cough or wheezing.     Referring provider: No ref. provider found  HPI: 42 year old female. PMH chronic cough, asthma. Patient of Dr. Patsey Berthold.   06/21/2022 Feeling great, states this is the best she has felt in over year Using Trelegy 270mcg daily She has been elimating nitrates and dairy in her diet   She has allergies to dust mites, cats and dogs  Would like to be on as little of medication as possible  Repeat FENO today was 10 (63)   No Known Allergies   There is no immunization history on file for this patient.  History reviewed. No pertinent past medical history.  Tobacco History: Social History   Tobacco Use  Smoking Status Former   Packs/day: 1.00   Years: 15.00   Total pack years: 15.00   Types: Cigarettes  Smokeless Tobacco Never   Counseling given: Not Answered   Outpatient Medications Prior to Visit  Medication Sig Dispense Refill   albuterol (PROVENTIL) (2.5 MG/3ML) 0.083% nebulizer solution Take 2.5 mg by nebulization every 6 (six) hours as needed for wheezing or shortness of breath.     albuterol (VENTOLIN HFA) 108 (90 Base) MCG/ACT inhaler Inhale 2 puffs into the lungs every 6 (six) hours as needed for wheezing or shortness of breath. 8 g 2   Fluticasone-Umeclidin-Vilant (TRELEGY ELLIPTA) 200-62.5-25 MCG/ACT AEPB Inhale 1 puff into the lungs daily. 14 each 0   Fluticasone-Umeclidin-Vilant (TRELEGY ELLIPTA) 200-62.5-25 MCG/ACT AEPB Inhale 1 puff into the lungs daily. 28 each 11   Vitamin D, Ergocalciferol, (DRISDOL) 1.25 MG (50000 UNIT) CAPS capsule Take 1 capsule (50,000 Units total) by mouth every 7 (seven) days. 12 capsule 0   diphenhydrAMINE (BENADRYL) 25 mg capsule Take 25 mg by mouth every 6 (six) hours as needed.     No facility-administered medications prior to visit.   Review  of Systems  Review of Systems  Constitutional: Negative.   HENT: Negative.    Respiratory: Negative.    Cardiovascular: Negative.    Physical Exam  BP 118/80 (BP Location: Left Arm, Cuff Size: Large)   Pulse 81   Temp (!) 97.1 F (36.2 C)   Ht 5\' 2"  (1.575 m)   Wt 216 lb (98 kg)   SpO2 97%   BMI 39.51 kg/m  Physical Exam Constitutional:      Appearance: Normal appearance.  HENT:     Head: Normocephalic and atraumatic.     Mouth/Throat:     Mouth: Mucous membranes are moist.     Pharynx: Oropharynx is clear.  Cardiovascular:     Rate and Rhythm: Normal rate and regular rhythm.  Pulmonary:     Effort: Pulmonary effort is normal.     Breath sounds: Normal breath sounds. No wheezing.     Comments: CTA; No wheezing  Musculoskeletal:        General: Normal range of motion.  Skin:    General: Skin is warm and dry.  Neurological:     General: No focal deficit present.     Mental Status: She is alert and oriented to person, place, and time. Mental status is at baseline.  Psychiatric:        Mood and Affect: Mood normal.        Behavior: Behavior normal.  Thought Content: Thought content normal.        Judgment: Judgment normal.      Lab Results:  CBC    Component Value Date/Time   WBC 6.7 05/10/2022 1100   RBC 5.05 05/10/2022 1100   HGB 13.3 05/10/2022 1100   HCT 41.6 05/10/2022 1100   PLT 280 05/10/2022 1100   MCV 82.4 05/10/2022 1100   MCH 26.3 05/10/2022 1100   MCHC 32.0 05/10/2022 1100   RDW 14.3 05/10/2022 1100   LYMPHSABS 1.5 05/10/2022 1100   MONOABS 0.4 05/10/2022 1100   EOSABS 0.5 05/10/2022 1100   BASOSABS 0.1 05/10/2022 1100    BMET    Component Value Date/Time   NA 138 10/13/2021 1655   K 3.8 10/13/2021 1655   CL 104 10/13/2021 1655   CO2 25 10/13/2021 1655   GLUCOSE 82 10/13/2021 1655   BUN 18 10/13/2021 1655   CREATININE 0.83 10/13/2021 1655   CALCIUM 9.5 10/13/2021 1655   GFRNONAA >60 10/13/2021 1655    BNP No results  found for: "BNP"  ProBNP No results found for: "PROBNP"  Imaging: No results found.   Assessment & Plan:   Mild persistent asthma - Clinically improved. No recent flare ups. Sare SABA use.  - Allergies to dusk mites, cats and dogs. Repeat FENO 10 (63)  - Continue Trelegy 236mcg - take one puff daily in the morning - Use Albuterol rescue inhaler 2 puffs every 4-6 hours as needed for breakthrough shortness of breath/wheezing  - Recommend starting Singulair (montelukast) at bedtime for allergies/asthma (please read information below and let me know if you want a prescription sent in) - My hope is we can de-escalate inhaler therapy at follow-up doing well and have not had any breakthrough asthma symptoms/flare ups   Follow-up: - 3 months with Dr. Patsey Berthold or Laura Maize NP     Martyn Ehrich, NP 06/26/2022

## 2022-06-21 NOTE — Patient Instructions (Addendum)
Recommendations: - Continue Trelegy - take one puff daily in the morning - Use Albuterol rescue inhaler 2 puffs every 4-6 hours as needed for breakthrough shortness of breath/wheezing  - Recommend starting Singulair (montelukast) at bedtime for allergies/asthma (please read information below and let me know if you want a prescription sent in) - My hope is we can de-escalate inhaler therapy at follow-up if you are doing well and have not had any breakthrough asthma symptoms/flare ups   Orders: - FENO  Follow-up: - 3 months with Dr. Jayme Cloud or Waynetta Sandy NP  Asthma, Adult  Asthma is a condition that causes swelling and narrowing of the airways. These are the passages that lead from the nose and mouth down into the lungs. When asthma symptoms get worse it is called an asthma attack or flare. This can make it hard to breathe. Asthma flares can range from minor to life-threatening. There is no cure for asthma, but medicines and lifestyle changes can help to control it. What are the causes? It is not known exactly what causes asthma, but certain things can cause asthma symptoms to get worse (triggers). What can trigger an asthma attack? Cigarette smoke. Mold. Dust. Your pet's skin flakes (dander). Cockroaches. Pollen. Air pollution (like household cleaners, wood smoke, smog, or Therapist, occupational). What are the signs or symptoms? Trouble breathing (shortness of breath). Coughing. Making high-pitched whistling sounds when you breathe, most often when you breathe out (wheezing). Chest tightness. Tiredness with little activity. Poor exercise tolerance. How is this treated? Controller medicines that help prevent asthma symptoms. Fast-acting reliever or rescue medicines. These give short-term relief of asthma symptoms. Allergy medicines if your attacks are brought on by allergens. Medicines to help control the body's defense (immune) system. Staying away from the things that cause asthma  attacks. Follow these instructions at home: Avoiding triggers in your home Do not allow anyone to smoke in your home. Limit use of fireplaces and wood stoves. Get rid of pests (such as roaches and mice) and their droppings. Keep your home clean. Clean your floors. Dust regularly. Use cleaning products that do not smell. Wash bed sheets and blankets every week in hot water. Dry them in a dryer. Have someone vacuum when you are not home. Change your heating and air conditioning filters often. Use blankets that are made of polyester or cotton. General instructions Take over-the-counter and prescription medicines only as told by your doctor. Do not smoke or use any products that contain nicotine or tobacco. If you need help quitting, ask your doctor. Stay away from secondhand smoke. Avoid doing things outdoors when allergen counts are high and when air quality is low. Warm up before you exercise. Take time to cool down after exercise. Use a peak flow meter as told by your doctor. A peak flow meter is a tool that measures how well your lungs are working. Keep track of the peak flow meter's readings. Write them down. Follow your asthma action plan. This is a written plan for taking care of your asthma and treating your attacks. Make sure you get all the shots (vaccines) that your doctor recommends. Ask your doctor about a flu shot and a pneumonia shot. Keep all follow-up visits. Contact a doctor if: You have wheezing, shortness of breath, or a cough even while taking medicine to prevent attacks. The mucus you cough up (sputum) is thicker than usual. The mucus you cough up changes from clear or white to yellow, green, gray, or is bloody. You have  problems from the medicine you are taking, such as: A rash. Itching. Swelling. Trouble breathing. You need reliever medicines more than 2-3 times a week. Your peak flow reading is still at 50-79% of your personal best after following the action  plan for 1 hour. You have a fever. Get help right away if: You seem to be worse and are not responding to medicine during an asthma attack. You are short of breath even at rest. You get short of breath when doing very little activity. You have trouble eating, drinking, or talking. You have chest pain or tightness. You have a fast heartbeat. Your lips or fingernails start to turn blue. You are light-headed or dizzy, or you faint. Your peak flow is less than 50% of your personal best. You feel too tired to breathe normally. These symptoms may be an emergency. Get help right away. Call 911. Do not wait to see if the symptoms will go away. Do not drive yourself to the hospital. Summary Asthma is a long-term (chronic) condition in which the airways get tight and narrow. An asthma attack can make it hard to breathe. Asthma cannot be cured, but medicines and lifestyle changes can help control it. Make sure you understand how to avoid triggers and how and when to use your medicines. Avoid things that can cause allergy symptoms (allergens). These include animal skin flakes (dander) and pollen from trees or grass. Avoid things that pollute the air. These may include household cleaners, wood smoke, smog, or chemical odors. This information is not intended to replace advice given to you by your health care provider. Make sure you discuss any questions you have with your health care provider. Document Revised: 03/07/2021 Document Reviewed: 03/07/2021 Elsevier Patient Education  [place].   Montelukast Tablets What is this medication? MONTELUKAST (mon te LOO kast) prevents and treats the symptoms of asthma and allergies. It works by decreasing inflammation in the airways, making it easier to breathe. Do not use this medication to treat a sudden asthma attack. This medicine may be used for other purposes; ask your health care provider or pharmacist if you have questions. COMMON BRAND  NAME(S): Singulair What should I tell my care team before I take this medication? They need to know if you have any of these conditions: Liver disease An unusual or allergic reaction to montelukast, other medications, foods, dyes, or preservatives Pregnant or trying to get pregnant Breastfeeding How should I use this medication? Take this medication by mouth with water. Take it as directed on the prescription label at the same time every day. You can take this medication with or without food. If it upsets your stomach, take it with food. Keep taking it unless your care team tells you to stop. A special MedGuide will be given to you by the pharmacist with each prescription and refill. Be sure to read this information carefully each time. Talk to your care team about the use of this medication in children. While this medication may be prescribed for children as young as 15 years for selected conditions, precautions do apply. Overdosage: If you think you have taken too much of this medicine contact a poison control center or emergency room at once. NOTE: This medicine is only for you. Do not share this medicine with others. What if I miss a dose? If you miss a dose, skip it. Take your next dose at the normal time. Do not take extra or 2 doses at the same time to make  up for the missed dose. What may interact with this medication? Medications for seizures, such as phenytoin, phenobarbital, carbamazepine Rifabutin Rifampin This list may not describe all possible interactions. Give your health care provider a list of all the medicines, herbs, non-prescription drugs, or dietary supplements you use. Also tell them if you smoke, drink alcohol, or use illegal drugs. Some items may interact with your medicine. What should I watch for while using this medication? Visit your care team for regular checks on your progress. Tell your care team if your allergy or asthma symptoms do not improve. Take your  medication even when you do not have symptoms. If you have asthma, talk to your care team about what to do in an acute asthma attack. Always have your rescue medication for asthma attacks with you. This medication may cause thoughts of suicide or depression. This includes sudden changes in mood, behaviors, or thoughts. Also watch for sudden changes in feelings and behaviors, such as feeling anxious, agitated, panicky, irritable, hostile, aggressive, impulsive, severely restless, overly excited and hyperactive, or not being able to sleep. Call your care team right away if you experience a change in behavior or worsening depression. What side effects may I notice from receiving this medication? Side effects that you should report to your care team as soon as possible: Allergic reactions--skin rash, itching, hives, swelling of the face, lips, tongue, or throat Flu-like symptoms--fever, chills, muscle pain, cough, headache, fatigue Mood and behavior changes such as anxiety, nervousness, confusion, hallucinations, irritability, hostility, thoughts of suicide or self-harm, worsening mood, feelings of depression Pain, tingling, or numbness in the hands or feet Sinus pain or pressure around the face or forehead Trouble sleeping Vivid dreams or nightmares Side effects that usually do not require medical attention (report to your care team if they continue or are bothersome): Cough Diarrhea Headache Runny or stuffy nose Sore throat Stomach pain This list may not describe all possible side effects. Call your doctor for medical advice about side effects. You may report side effects to FDA at 1-800-FDA-1088. Where should I keep my medication? Keep out of the reach of children and pets. Store at room temperature between 15 and 30 degrees C (59 and 86 degrees F). Protect from light and moisture. Protect from light and moisture. Keep the container tightly closed. Get rid of any unused medication after the  expiration date. To get rid of medications that are no longer needed or expired: Take the medication to a medication take-back program. Check with your pharmacy or law enforcement to find a location. If you cannot return the medication, check the label or package insert to see if the medication should be thrown out in the garbage or flushed down the toilet. If you are not sure, ask your care team. If it is safe to put in the trash, empty the medication out of the container. Mix the medication with cat litter, dirt, coffee grounds, or other unwanted substance. Seal the mixture in a bag or container. Put it in the trash. NOTE: This sheet is a summary. It may not cover all possible information. If you have questions about this medicine, talk to your doctor, pharmacist, or health care provider.  2023 Elsevier/Gold Standard (2020-05-14 00:00:00)

## 2022-06-26 DIAGNOSIS — J4530 Mild persistent asthma, uncomplicated: Secondary | ICD-10-CM | POA: Insufficient documentation

## 2022-06-26 NOTE — Assessment & Plan Note (Signed)
-  Clinically improved. No recent flare ups. Sare SABA use.  - Allergies to dusk mites, cats and dogs. Repeat FENO 10 (63)  - Continue Trelegy 214mcg - take one puff daily in the morning - Use Albuterol rescue inhaler 2 puffs every 4-6 hours as needed for breakthrough shortness of breath/wheezing  - Recommend starting Singulair (montelukast) at bedtime for allergies/asthma (please read information below and let me know if you want a prescription sent in) - My hope is we can de-escalate inhaler therapy at follow-up doing well and have not had any breakthrough asthma symptoms/flare ups   Follow-up: - 3 months with Dr. Patsey Berthold or Eustaquio Maize NP

## 2022-09-12 ENCOUNTER — Other Ambulatory Visit: Payer: Self-pay | Admitting: *Deleted

## 2022-09-12 MED ORDER — TRELEGY ELLIPTA 200-62.5-25 MCG/ACT IN AEPB: 1.0000 | INHALATION_SPRAY | Freq: Every day | RESPIRATORY_TRACT | 3 refills | Status: DC

## 2022-11-07 ENCOUNTER — Ambulatory Visit: Payer: BC Managed Care – PPO | Admitting: Primary Care

## 2022-11-23 ENCOUNTER — Ambulatory Visit: Payer: BC Managed Care – PPO | Admitting: Family Medicine

## 2022-12-11 ENCOUNTER — Ambulatory Visit: Payer: BC Managed Care – PPO | Admitting: Primary Care

## 2022-12-11 ENCOUNTER — Encounter: Payer: Self-pay | Admitting: Primary Care

## 2022-12-11 VITALS — BP 126/80 | HR 102 | Temp 98.0°F | Ht 62.0 in | Wt 201.2 lb

## 2022-12-11 DIAGNOSIS — J4530 Mild persistent asthma, uncomplicated: Secondary | ICD-10-CM | POA: Diagnosis not present

## 2022-12-11 DIAGNOSIS — J3081 Allergic rhinitis due to animal (cat) (dog) hair and dander: Secondary | ICD-10-CM | POA: Insufficient documentation

## 2022-12-11 MED ORDER — PREDNISONE 10 MG PO TABS: ORAL_TABLET | ORAL | 0 refills | Status: AC

## 2022-12-11 NOTE — Assessment & Plan Note (Signed)
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 No acute symptoms. She limits direct contact with her cat and vacuums daily. Recommend OTC antihistamine prn and/or montelukast but patient would like to avoid medication as much as possible

## 2022-12-11 NOTE — Patient Instructions (Addendum)
Recommendations: - Continue Trelegy (if after travel this summer asthma remains stable we can decrease Trelegy dose to or try BREO) - Use Albuterol 2 puffs every 4-6 hours for shortness for breath/wheezing  - Take prednisone if cough flares while traveling- make sure you also bring Albuterol rescue inhaler to use if needed - Use incentive spirometer 10 deep breaths 2-3 times a day - Weight loss encouraged as able  - If allergy symptoms are not well controlled start over the counter Zyrtec 10mg  daily   Orders: - Incentive spirometer   Rx: - Prednisone to have on hand while traveling   Follow-up: - 6 months with Dr. Jayme Cloud   Asthma, Adult  Asthma is a condition that causes swelling and narrowing of the airways. These are the passages that lead from the nose and mouth down into the lungs. When asthma symptoms get worse it is called an asthma attack or flare. This can make it hard to breathe. Asthma flares can range from minor to life-threatening. There is no cure for asthma, but medicines and lifestyle changes can help to control it. What are the causes? It is not known exactly what causes asthma, but certain things can cause asthma symptoms to get worse (triggers). What can trigger an asthma attack? Cigarette smoke. Mold. Dust. Your pet's skin flakes (dander). Cockroaches. Pollen. Air pollution (like household cleaners, wood smoke, smog, or Therapist, occupational). What are the signs or symptoms? Trouble breathing (shortness of breath). Coughing. Making high-pitched whistling sounds when you breathe, most often when you breathe out (wheezing). Chest tightness. Tiredness with little activity. Poor exercise tolerance. How is this treated? Controller medicines that help prevent asthma symptoms. Fast-acting reliever or rescue medicines. These give short-term relief of asthma symptoms. Allergy medicines if your attacks are brought on by allergens. Medicines to help control  the body's defense (immune) system. Staying away from the things that cause asthma attacks. Follow these instructions at home: Avoiding triggers in your home Do not allow anyone to smoke in your home. Limit use of fireplaces and wood stoves. Get rid of pests (such as roaches and mice) and their droppings. Keep your home clean. Clean your floors. Dust regularly. Use cleaning products that do not smell. Wash bed sheets and blankets every week in hot water. Dry them in a dryer. Have someone vacuum when you are not home. Change your heating and air conditioning filters often. Use blankets that are made of polyester or cotton. General instructions Take over-the-counter and prescription medicines only as told by your doctor. Do not smoke or use any products that contain nicotine or tobacco. If you need help quitting, ask your doctor. Stay away from secondhand smoke. Avoid doing things outdoors when allergen counts are high and when air quality is low. Warm up before you exercise. Take time to cool down after exercise. Use a peak flow meter as told by your doctor. A peak flow meter is a tool that measures how well your lungs are working. Keep track of the peak flow meter's readings. Write them down. Follow your asthma action plan. This is a written plan for taking care of your asthma and treating your attacks. Make sure you get all the shots (vaccines) that your doctor recommends. Ask your doctor about a flu shot and a pneumonia shot. Keep all follow-up visits. Contact a doctor if: You have wheezing, shortness of breath, or a cough even while taking medicine to prevent attacks. The mucus you cough up (sputum) is thicker than  usual. The mucus you cough up changes from clear or white to yellow, green, gray, or is bloody. You have problems from the medicine you are taking, such as: A rash. Itching. Swelling. Trouble breathing. You need reliever medicines more than 2-3 times a week. Your peak  flow reading is still at 50-79% of your personal best after following the action plan for 1 hour. You have a fever. Get help right away if: You seem to be worse and are not responding to medicine during an asthma attack. You are short of breath even at rest. You get short of breath when doing very little activity. You have trouble eating, drinking, or talking. You have chest pain or tightness. You have a fast heartbeat. Your lips or fingernails start to turn blue. You are light-headed or dizzy, or you faint. Your peak flow is less than 50% of your personal best. You feel too tired to breathe normally. These symptoms may be an emergency. Get help right away. Call 911. Do not wait to see if the symptoms will go away. Do not drive yourself to the hospital. Summary Asthma is a long-term (chronic) condition in which the airways get tight and narrow. An asthma attack can make it hard to breathe. Asthma cannot be cured, but medicines and lifestyle changes can help control it. Make sure you understand how to avoid triggers and how and when to use your medicines. Avoid things that can cause allergy symptoms (allergens). These include animal skin flakes (dander) and pollen from trees or grass. Avoid things that pollute the air. These may include household cleaners, wood smoke, smog, or chemical odors. This information is not intended to replace advice given to you by your health care provider. Make sure you discuss any questions you have with your health care provider. Document Revised: 03/07/2021 Document Reviewed: 03/07/2021 Elsevier Patient Education  2024 [employer].

## 2022-12-11 NOTE — Assessment & Plan Note (Addendum)
-   Doing well; Asthma symptoms significantly improved with Trelegy. No recent flare ups. No SABA use. FENO 10 (63). - Allergies to cats, dust mites and dogs; Eos 500 in Nov 2023 - Continue Trelegy one puff daily and prn Albuterol hfa - Sending in RX for prednisone to have on hand while traveling  - If asthma symptoms remain well controlled, would de-escalate maintenance inhaler to either Trelegy or BREO at follow-up - FU in 6 months or sooner if needed

## 2022-12-11 NOTE — Progress Notes (Signed)
Agree with the details of the visit as noted by Elizabeth Walsh, NP.  C. Laura Byrant Valent, MD [facility] 

## 2022-12-11 NOTE — Progress Notes (Signed)
@Patient  ID: Laura Fry, female    DOB: Oct 03, 1980, 42 y.o.   MRN: [id number]  Chief Complaint  Patient presents with   Follow-up    Breathing has improved with Trelegy. No current sx.     Referring provider: No ref. provider found  HPI: 42 year old female. PMH chronic cough, asthma, cat allergy. Patient of Dr. Jayme Cloud.   12/11/2022 Patient is doing well on Trelegy. Medication has significantly helped. No recent flares or breathing issues. She has not needed to use albuterol. Her energy and exercise capacity has improved, she has more stamina.  She is traveling to [place] in 2 weeks and [place] in August for work conference. She tells me her cough typically flares when she travels. She has a cat at home. She has class 5 allergy to cats. She vacuums daily and limits direct contact. She would like to be on as little medication as possible, she does not wish to take OTC antihistamine or montelukast.    No Known Allergies   There is no immunization history on file for this patient.  No past medical history on file.  Tobacco History: Social History   Tobacco Use  Smoking Status Former   Packs/day: 1.00   Years: 15.00   Additional pack years: 0.00   Total pack years: 15.00   Types: Cigarettes   Quit date: 2011   Years since quitting: 13.5  Smokeless Tobacco Never   Counseling given: Not Answered   Outpatient Medications Prior to Visit  Medication Sig Dispense Refill   albuterol (PROVENTIL) (2.5 MG/3ML) 0.083% nebulizer solution Take 2.5 mg by nebulization every 6 (six) hours as needed for wheezing or shortness of breath.     albuterol (VENTOLIN HFA) 108 (90 Base) MCG/ACT inhaler Inhale 2 puffs into the lungs every 6 (six) hours as needed for wheezing or shortness of breath. 8 g 2   Fluticasone-Umeclidin-Vilant (TRELEGY ELLIPTA) 200-62.5-25 MCG/ACT AEPB Inhale 1 puff into the lungs daily. 180 each 3   Vitamin D, Ergocalciferol, (DRISDOL) 1.25 MG (50000 UNIT) CAPS  capsule Take 1 capsule (50,000 Units total) by mouth every 7 (seven) days. 12 capsule 0   diphenhydrAMINE (BENADRYL) 25 mg capsule Take 25 mg by mouth every 6 (six) hours as needed.     No facility-administered medications prior to visit.   Review of Systems  Review of Systems  Constitutional: Negative.   HENT:  Negative for congestion, postnasal drip and sneezing.   Respiratory: Negative.  Negative for cough, chest tightness, shortness of breath and wheezing.   Cardiovascular: Negative.    Physical Exam  BP 126/80 (BP Location: Left Arm, Cuff Size: Normal)   Pulse (!) 102   Temp 98 F (36.7 C) (Temporal)   Ht 5\' 2"  (1.575 m)   Wt 201 lb 3.2 oz (91.3 kg)   SpO2 97%   BMI 36.80 kg/m  Physical Exam Constitutional:      General: She is not in acute distress.    Appearance: Normal appearance. She is not ill-appearing.  HENT:     Head: Normocephalic and atraumatic.     Mouth/Throat:     Mouth: Mucous membranes are moist.     Pharynx: Oropharynx is clear.  Cardiovascular:     Rate and Rhythm: Normal rate and regular rhythm.  Pulmonary:     Effort: Pulmonary effort is normal.     Breath sounds: Normal breath sounds.  Musculoskeletal:        General: Normal range of motion.  Skin:  General: Skin is warm and dry.  Neurological:     General: No focal deficit present.     Mental Status: She is alert and oriented to person, place, and time. Mental status is at baseline.  Psychiatric:        Mood and Affect: Mood normal.        Behavior: Behavior normal.        Thought Content: Thought content normal.        Judgment: Judgment normal.      Lab Results:  CBC    Component Value Date/Time   WBC 6.7 05/10/2022 1100   RBC 5.05 05/10/2022 1100   HGB 13.3 05/10/2022 1100   HCT 41.6 05/10/2022 1100   PLT 280 05/10/2022 1100   MCV 82.4 05/10/2022 1100   MCH 26.3 05/10/2022 1100   MCHC 32.0 05/10/2022 1100   RDW 14.3 05/10/2022 1100   LYMPHSABS 1.5 05/10/2022 1100    MONOABS 0.4 05/10/2022 1100   EOSABS 0.5 05/10/2022 1100   BASOSABS 0.1 05/10/2022 1100    BMET    Component Value Date/Time   NA 138 10/13/2021 1655   K 3.8 10/13/2021 1655   CL 104 10/13/2021 1655   CO2 25 10/13/2021 1655   GLUCOSE 82 10/13/2021 1655   BUN 18 10/13/2021 1655   CREATININE 0.83 10/13/2021 1655   CALCIUM 9.5 10/13/2021 1655   GFRNONAA >60 10/13/2021 1655    BNP No results found for: "BNP"  ProBNP No results found for: "PROBNP"  Imaging: No results found.   Assessment & Plan:   Mild persistent asthma - Doing well; Asthma symptoms significantly improved with Trelegy. No recent flare ups. No SABA use. FENO 10 (63). 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 No acute symptoms. She limits direct contact with her cat and vacuums daily. Recommend OTC antihistamine prn and/or montelukast but patient would like to avoid medication as much as possible   Glenford Bayley, NP 12/11/2022

## 2023-06-25 ENCOUNTER — Telehealth: Payer: Self-pay | Admitting: Pulmonary Disease

## 2023-06-25 MED ORDER — TRELEGY ELLIPTA 200-62.5-25 MCG/ACT IN AEPB: 1.0000 | INHALATION_SPRAY | Freq: Every day | RESPIRATORY_TRACT | 3 refills | Status: AC

## 2023-06-25 NOTE — Telephone Encounter (Signed)
 Patient states needs refill for Trelegy. Pharmacy is [facility] [place]. Patient scheduled 08/08/2023 with Dr. Jayme Cloud. Also checking on allergy medication. Patient phone number is [phone].

## 2023-06-25 NOTE — Telephone Encounter (Signed)
 ALL allergy medications are now over-the-counter.  I recommend Allegra Allergy 24-hour, 180 mg, 1 tablet daily.

## 2023-06-25 NOTE — Telephone Encounter (Signed)
 All antihistamines are now over-the-counter.  Is she referring to montelukast (Singulair)?  That would be prescription.

## 2023-06-25 NOTE — Telephone Encounter (Signed)
 She said she feels like her allergies are bothering her and she would like an allergy medication sent in.  Please advise.

## 2023-06-25 NOTE — Telephone Encounter (Signed)
 I have notified the patient. Nothing further needed.

## 2023-06-25 NOTE — Telephone Encounter (Signed)
 I have sent in the refill on her Trelegy. I spoke with the patient regarding the allergy medication. She said the NP had recommended one to her but she did not know what it was and has not been taking any allergy medications.   Per Almarie Ferrari, NP's last office visit note- - If allergy symptoms are not well controlled start over the counter Zyrtec 10mg  daily   I notified the patient. She said that is not the medication she was told and that she wanted a prescription for an allergy medication.  Dr. Tamea please advise.

## 2023-06-28 IMAGING — DX DG CHEST 1V
1 series · 1 of 1 positions shown · non-contrast
Comparison: Chest two views 04/06/2021

CLINICAL DATA: Shortness of breath.  Bilateral flank pain.

EXAM:
CHEST  1 VIEW

[chest ap]
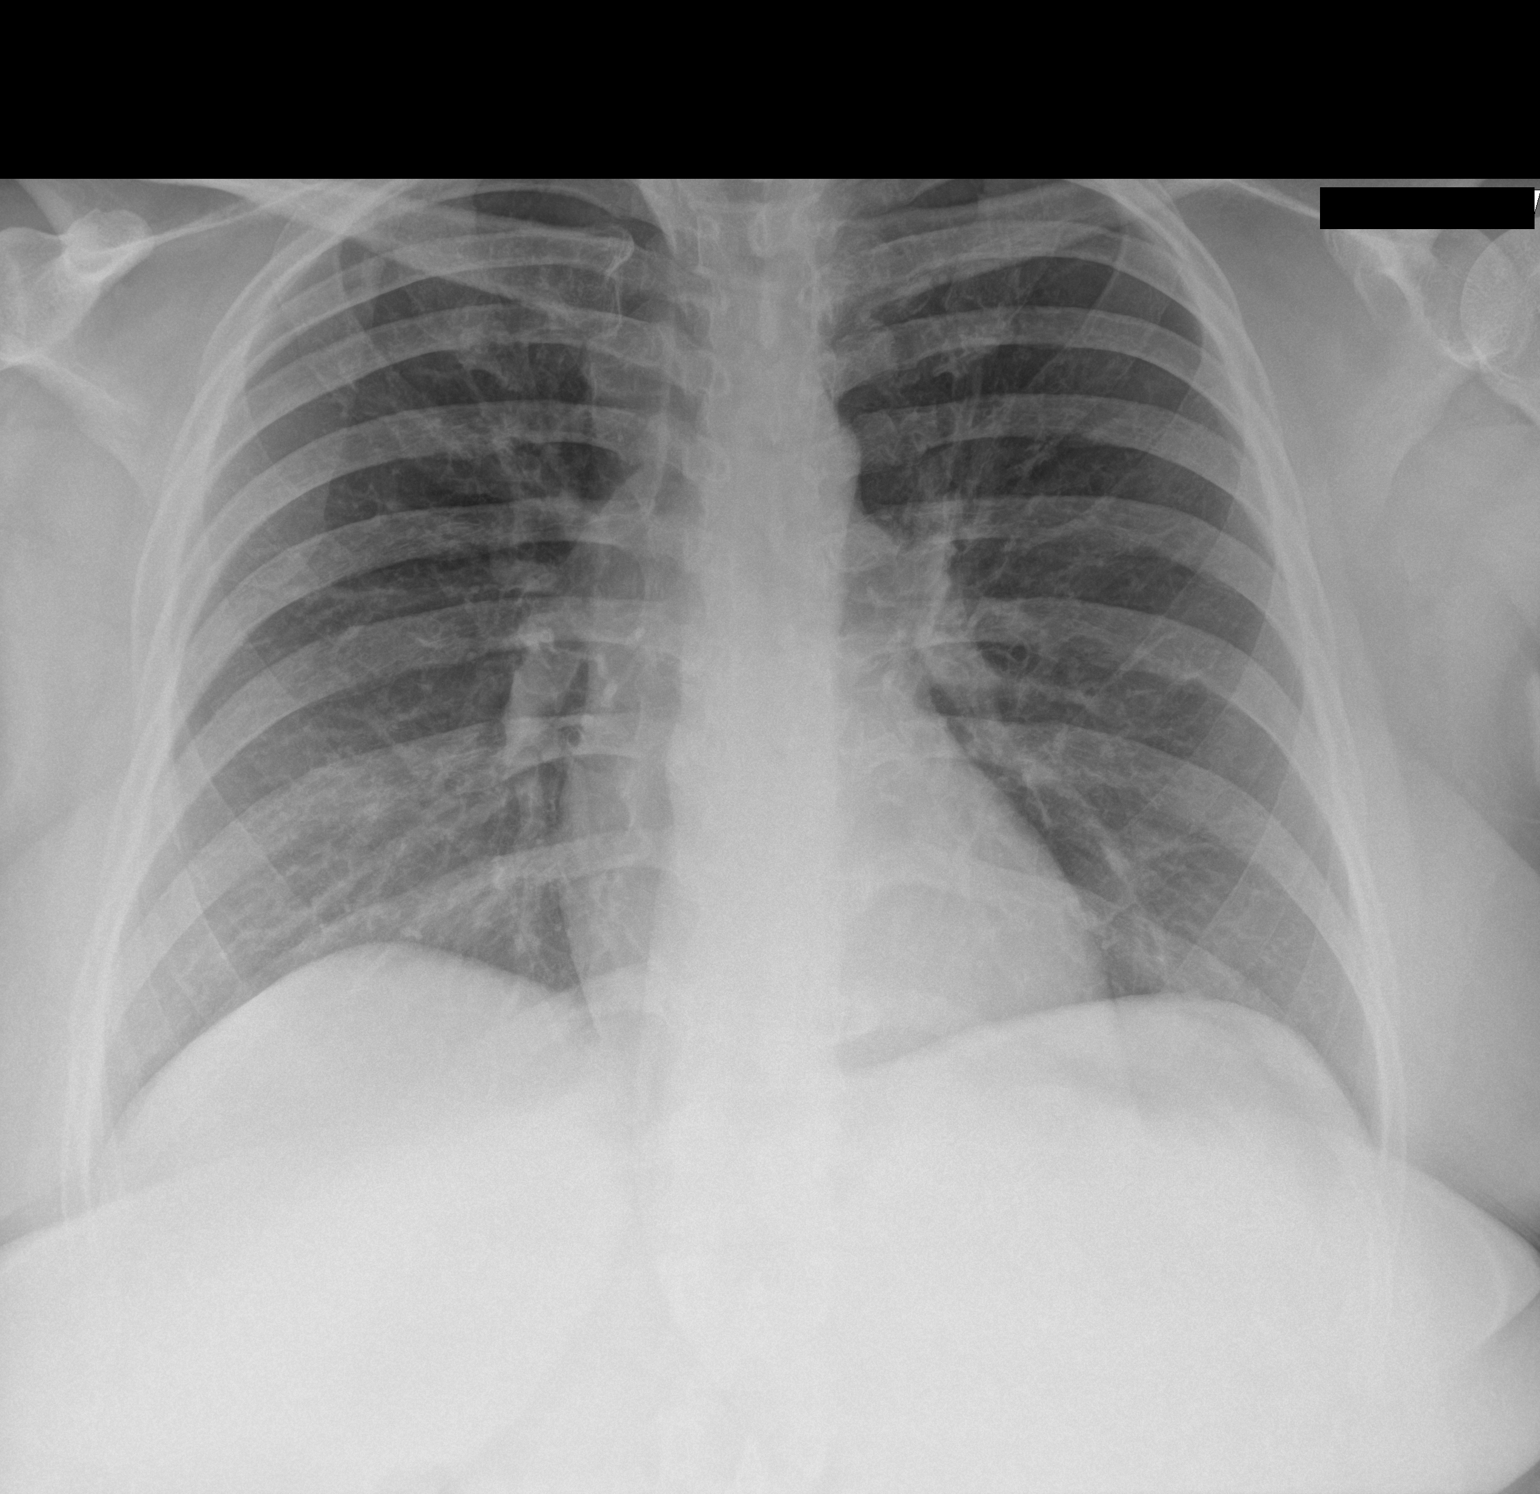

[1 of 1 positions shown; findings below may reference images not displayed]

FINDINGS: Cardiac silhouette and mediastinal contours are within normal
limits. The lungs are clear. No pleural effusion or pneumothorax. No
acute skeletal abnormality.
IMPRESSION: No active disease.

## 2023-08-08 ENCOUNTER — Ambulatory Visit: Payer: BC Managed Care – PPO | Admitting: Pulmonary Disease
